# Patient Record
Sex: Male | Born: 1980 | Race: White | Hispanic: No | Marital: Single | Smoking: Never smoker
Health system: Southern US, Community
[De-identification: ages and names within clinical notes are randomized; demographics above are authoritative.]

## PROBLEM LIST (undated history)

## (undated) DIAGNOSIS — K219 Gastro-esophageal reflux disease without esophagitis: Secondary | ICD-10-CM

## (undated) DIAGNOSIS — G5603 Carpal tunnel syndrome, bilateral upper limbs: Secondary | ICD-10-CM

## (undated) HISTORY — PX: WISDOM TOOTH EXTRACTION: SHX21

---

## 2013-05-25 ENCOUNTER — Other Ambulatory Visit: Payer: Self-pay | Admitting: Orthopedic Surgery

## 2013-06-22 DIAGNOSIS — G5603 Carpal tunnel syndrome, bilateral upper limbs: Secondary | ICD-10-CM

## 2013-06-22 HISTORY — DX: Carpal tunnel syndrome, bilateral upper limbs: G56.03

## 2013-06-23 ENCOUNTER — Encounter (HOSPITAL_BASED_OUTPATIENT_CLINIC_OR_DEPARTMENT_OTHER): Payer: Self-pay | Admitting: *Deleted

## 2013-06-29 ENCOUNTER — Encounter (HOSPITAL_BASED_OUTPATIENT_CLINIC_OR_DEPARTMENT_OTHER): Payer: Self-pay | Admitting: *Deleted

## 2013-06-29 ENCOUNTER — Ambulatory Visit (HOSPITAL_BASED_OUTPATIENT_CLINIC_OR_DEPARTMENT_OTHER)
Admission: RE | Admit: 2013-06-29 | Discharge: 2013-06-29 | Disposition: A | Discharge status: Home / Self Care | Payer: 59 | Source: Ambulatory Visit | Attending: Orthopedic Surgery | Admitting: Orthopedic Surgery

## 2013-06-29 ENCOUNTER — Ambulatory Visit (HOSPITAL_BASED_OUTPATIENT_CLINIC_OR_DEPARTMENT_OTHER): Payer: 59 | Admitting: Anesthesiology

## 2013-06-29 ENCOUNTER — Encounter (HOSPITAL_BASED_OUTPATIENT_CLINIC_OR_DEPARTMENT_OTHER)
Admission: RE | Disposition: A | Discharge status: Home / Self Care | Payer: Self-pay | Source: Ambulatory Visit | Attending: Orthopedic Surgery

## 2013-06-29 ENCOUNTER — Encounter (HOSPITAL_BASED_OUTPATIENT_CLINIC_OR_DEPARTMENT_OTHER): Payer: 59 | Admitting: Anesthesiology

## 2013-06-29 DIAGNOSIS — IMO0002 Reserved for concepts with insufficient information to code with codable children: Secondary | ICD-10-CM | POA: Insufficient documentation

## 2013-06-29 DIAGNOSIS — F17200 Nicotine dependence, unspecified, uncomplicated: Secondary | ICD-10-CM | POA: Insufficient documentation

## 2013-06-29 DIAGNOSIS — K219 Gastro-esophageal reflux disease without esophagitis: Secondary | ICD-10-CM | POA: Insufficient documentation

## 2013-06-29 DIAGNOSIS — Z79899 Other long term (current) drug therapy: Secondary | ICD-10-CM | POA: Insufficient documentation

## 2013-06-29 HISTORY — PX: CARPAL TUNNEL RELEASE: SHX101

## 2013-06-29 HISTORY — DX: Carpal tunnel syndrome, bilateral upper limbs: G56.03

## 2013-06-29 HISTORY — DX: Gastro-esophageal reflux disease without esophagitis: K21.9

## 2013-06-29 LAB — POCT HEMOGLOBIN-HEMACUE: Hemoglobin: 15.3 g/dL (ref 13.0–17.0)

## 2013-06-29 SURGERY — CARPAL TUNNEL RELEASE
Anesthesia: Monitor Anesthesia Care | Site: Wrist | Laterality: Bilateral

## 2013-06-29 MED ORDER — MIDAZOLAM HCL 2 MG/ML PO SYRP: 12.0000 mg | ORAL_SOLUTION | Freq: Once | ORAL | Status: DC | PRN

## 2013-06-29 MED ORDER — ONDANSETRON HCL 4 MG/2ML IJ SOLN
INTRAMUSCULAR | Status: DC | PRN
  Administered 2013-06-29: 4 mg via INTRAVENOUS

## 2013-06-29 MED ORDER — DEXAMETHASONE SODIUM PHOSPHATE 10 MG/ML IJ SOLN
INTRAMUSCULAR | Status: DC | PRN
  Administered 2013-06-29: 10 mg via INTRAVENOUS

## 2013-06-29 MED ORDER — MIDAZOLAM HCL 5 MG/5ML IJ SOLN
INTRAMUSCULAR | Status: DC | PRN
  Administered 2013-06-29: 2 mg via INTRAVENOUS

## 2013-06-29 MED ORDER — OXYCODONE HCL 5 MG/5ML PO SOLN: 5.0000 mg | Freq: Once | ORAL | Status: DC | PRN

## 2013-06-29 MED ORDER — FENTANYL CITRATE 0.05 MG/ML IJ SOLN: 50.0000 ug | INTRAMUSCULAR | Status: DC | PRN

## 2013-06-29 MED ORDER — OXYCODONE HCL 5 MG PO TABS: 5.0000 mg | ORAL_TABLET | Freq: Once | ORAL | Status: DC | PRN

## 2013-06-29 MED ORDER — CHLORHEXIDINE GLUCONATE 4 % EX LIQD: 60.0000 mL | Freq: Once | CUTANEOUS | Status: DC

## 2013-06-29 MED ORDER — BUPIVACAINE HCL (PF) 0.25 % IJ SOLN
INTRAMUSCULAR | Status: AC
  Filled 2013-06-29: qty 30

## 2013-06-29 MED ORDER — CEFAZOLIN SODIUM-DEXTROSE 2-3 GM-% IV SOLR
INTRAVENOUS | Status: DC | PRN
  Administered 2013-06-29: 2 g via INTRAVENOUS

## 2013-06-29 MED ORDER — LACTATED RINGERS IV SOLN
INTRAVENOUS | Status: DC
  Administered 2013-06-29 (×2): via INTRAVENOUS

## 2013-06-29 MED ORDER — HYDROMORPHONE HCL PF 1 MG/ML IJ SOLN: 0.2500 mg | INTRAMUSCULAR | Status: DC | PRN

## 2013-06-29 MED ORDER — EPHEDRINE SULFATE 50 MG/ML IJ SOLN
INTRAMUSCULAR | Status: DC | PRN
  Administered 2013-06-29: 10 mg via INTRAVENOUS

## 2013-06-29 MED ORDER — BUPIVACAINE HCL (PF) 0.25 % IJ SOLN
INTRAMUSCULAR | Status: DC | PRN
  Administered 2013-06-29: 6 mL

## 2013-06-29 MED ORDER — PROPOFOL 10 MG/ML IV BOLUS
INTRAVENOUS | Status: DC | PRN
  Administered 2013-06-29: 200 mg via INTRAVENOUS

## 2013-06-29 MED ORDER — LIDOCAINE HCL (CARDIAC) 20 MG/ML IV SOLN
INTRAVENOUS | Status: DC | PRN
  Administered 2013-06-29: 50 mg via INTRAVENOUS

## 2013-06-29 MED ORDER — HYDROCODONE-ACETAMINOPHEN 5-325 MG PO TABS: 1.0000 | ORAL_TABLET | Freq: Four times a day (QID) | ORAL | Status: AC | PRN

## 2013-06-29 MED ORDER — MIDAZOLAM HCL 2 MG/2ML IJ SOLN: 1.0000 mg | INTRAMUSCULAR | Status: DC | PRN

## 2013-06-29 MED ORDER — FENTANYL CITRATE 0.05 MG/ML IJ SOLN
INTRAMUSCULAR | Status: DC | PRN
  Administered 2013-06-29: 100 ug via INTRAVENOUS

## 2013-06-29 SURGICAL SUPPLY — 43 items
ADH SKN CLS APL DERMABOND .7 (GAUZE/BANDAGES/DRESSINGS) ×2
BLADE SURG 15 STRL LF DISP TIS (BLADE) ×1 IMPLANT
BLADE SURG 15 STRL SS (BLADE) ×2
BNDG CMPR 9X4 STRL LF SNTH (GAUZE/BANDAGES/DRESSINGS) ×1
BNDG COHESIVE 3X5 TAN STRL LF (GAUZE/BANDAGES/DRESSINGS) ×3 IMPLANT
BNDG ESMARK 4X9 LF (GAUZE/BANDAGES/DRESSINGS) ×1 IMPLANT
BNDG GAUZE ELAST 4 BULKY (GAUZE/BANDAGES/DRESSINGS) ×1 IMPLANT
CHLORAPREP W/TINT 26ML (MISCELLANEOUS) ×3 IMPLANT
CORDS BIPOLAR (ELECTRODE) ×3 IMPLANT
COVER MAYO STAND STRL (DRAPES) ×3 IMPLANT
COVER TABLE BACK 60X90 (DRAPES) ×2 IMPLANT
CUFF TOURNIQUET SINGLE 18IN (TOURNIQUET CUFF) ×3 IMPLANT
DERMABOND ADVANCED (GAUZE/BANDAGES/DRESSINGS) ×2
DERMABOND ADVANCED .7 DNX12 (GAUZE/BANDAGES/DRESSINGS) IMPLANT
DRAPE EXTREMITY T 121X128X90 (DRAPE) ×3 IMPLANT
DRAPE SURG 17X23 STRL (DRAPES) ×3 IMPLANT
DRSG KUZMA FLUFF (GAUZE/BANDAGES/DRESSINGS) ×1 IMPLANT
GAUZE XEROFORM 1X8 LF (GAUZE/BANDAGES/DRESSINGS) ×2 IMPLANT
GLOVE BIO SURGEON STRL SZ 6.5 (GLOVE) ×1 IMPLANT
GLOVE BIOGEL PI IND STRL 7.0 (GLOVE) IMPLANT
GLOVE BIOGEL PI IND STRL 8.5 (GLOVE) ×1 IMPLANT
GLOVE BIOGEL PI INDICATOR 7.0 (GLOVE) ×1
GLOVE BIOGEL PI INDICATOR 8.5 (GLOVE) ×1
GLOVE ECLIPSE 6.5 STRL STRAW (GLOVE) ×1 IMPLANT
GLOVE SURG ORTHO 8.0 STRL STRW (GLOVE) ×2 IMPLANT
GOWN BRE IMP PREV XXLGXLNG (GOWN DISPOSABLE) ×2 IMPLANT
GOWN PREVENTION PLUS XLARGE (GOWN DISPOSABLE) ×2 IMPLANT
NEEDLE 27GAX1X1/2 (NEEDLE) ×1 IMPLANT
NS IRRIG 1000ML POUR BTL (IV SOLUTION) ×2 IMPLANT
PACK BASIN DAY SURGERY FS (CUSTOM PROCEDURE TRAY) ×2 IMPLANT
PAD ABD 8X10 STRL (GAUZE/BANDAGES/DRESSINGS) ×2 IMPLANT
PAD CAST 3X4 CTTN HI CHSV (CAST SUPPLIES) ×1 IMPLANT
PADDING CAST ABS 4INX4YD NS (CAST SUPPLIES) ×1
PADDING CAST ABS COTTON 4X4 ST (CAST SUPPLIES) ×1 IMPLANT
PADDING CAST COTTON 3X4 STRL (CAST SUPPLIES)
SPONGE GAUZE 4X4 12PLY (GAUZE/BANDAGES/DRESSINGS) ×3 IMPLANT
STOCKINETTE 4X48 STRL (DRAPES) ×3 IMPLANT
SUT VICRYL 4-0 PS2 18IN ABS (SUTURE) IMPLANT
SUT VICRYL RAPIDE 4/0 PS 2 (SUTURE) ×2 IMPLANT
SYR BULB 3OZ (MISCELLANEOUS) ×2 IMPLANT
SYR CONTROL 10ML LL (SYRINGE) ×1 IMPLANT
TOWEL OR 17X24 6PK STRL BLUE (TOWEL DISPOSABLE) ×2 IMPLANT
UNDERPAD 30X30 INCONTINENT (UNDERPADS AND DIAPERS) ×3 IMPLANT

## 2013-06-29 NOTE — Anesthesia Preprocedure Evaluation (Addendum)
Anesthesia Evaluation  Patient identified by MRN, date of birth, ID band Patient awake    Reviewed: Allergy & Precautions, H&P , NPO status , Patient's Chart, lab work & pertinent test results  Airway Mallampati: I TM Distance: >3 FB Neck ROM: Full    Dental no notable dental hx. (+) Teeth Intact and Dental Advisory Given   Pulmonary neg pulmonary ROS,  breath sounds clear to auscultation  Pulmonary exam normal       Cardiovascular negative cardio ROS  Rhythm:Regular Rate:Normal     Neuro/Psych negative neurological ROS  negative psych ROS   GI/Hepatic Neg liver ROS, GERD-  Medicated and Controlled,  Endo/Other  negative endocrine ROS  Renal/GU negative Renal ROS  negative genitourinary   Musculoskeletal   Abdominal   Peds  Hematology negative hematology ROS (+)   Anesthesia Other Findings   Reproductive/Obstetrics negative OB ROS                          Anesthesia Physical Anesthesia Plan  ASA: II  Anesthesia Plan: General   Post-op Pain Management:    Induction: Intravenous  Airway Management Planned: LMA  Additional Equipment:   Intra-op Plan:   Post-operative Plan: Extubation in OR  Informed Consent: I have reviewed the patients History and Physical, chart, labs and discussed the procedure including the risks, benefits and alternatives for the proposed anesthesia with the patient or authorized representative who has indicated his/her understanding and acceptance.   Dental advisory given  Plan Discussed with: CRNA  Anesthesia Plan Comments:         Anesthesia Quick Evaluation

## 2013-06-29 NOTE — Anesthesia Postprocedure Evaluation (Signed)
  Anesthesia Post-op Note  Patient: Patrick Dougherty  Procedure(s) Performed: Procedure(s): BILATERAL CARPAL TUNNEL RELEASE (Bilateral)  Patient Location: PACU  Anesthesia Type:General  Level of Consciousness: awake and alert   Airway and Oxygen Therapy: Patient Spontanous Breathing  Post-op Pain: none  Post-op Assessment: Post-op Vital signs reviewed, Patient's Cardiovascular Status Stable and Respiratory Function Stable  Post-op Vital Signs: Reviewed  Filed Vitals:   06/29/13 1530  BP: 155/59  Pulse: 76  Temp:   Resp: 15    Complications: No apparent anesthesia complications

## 2013-06-29 NOTE — Op Note (Signed)
Dictation Number [id number]

## 2013-06-29 NOTE — Anesthesia Procedure Notes (Signed)
Procedure Name: LMA Insertion Date/Time: 06/29/2013 1:39 PM Performed by: Caren Macadam Pre-anesthesia Checklist: Patient identified, Emergency Drugs available, Suction available and Patient being monitored Patient Re-evaluated:Patient Re-evaluated prior to inductionOxygen Delivery Method: Circle System Utilized Preoxygenation: Pre-oxygenation with 100% oxygen Intubation Type: IV induction Ventilation: Mask ventilation without difficulty LMA: LMA inserted LMA Size: 4.0 Number of attempts: 1 Airway Equipment and Method: bite block Placement Confirmation: positive ETCO2 and breath sounds checked- equal and bilateral Tube secured with: Tape Dental Injury: Teeth and Oropharynx as per pre-operative assessment

## 2013-06-29 NOTE — Transfer of Care (Signed)
Immediate Anesthesia Transfer of Care Note  Patient: Patrick Dougherty  Procedure(s) Performed: Procedure(s): BILATERAL CARPAL TUNNEL RELEASE (Bilateral)  Patient Location: PACU  Anesthesia Type:General  Level of Consciousness: awake and alert   Airway & Oxygen Therapy: Patient Spontanous Breathing and Patient connected to face mask oxygen  Post-op Assessment: Report given to PACU RN and Post -op Vital signs reviewed and stable  Post vital signs: Reviewed and stable  Complications: No apparent anesthesia complications

## 2013-06-29 NOTE — Transfer of Care (Deleted)
Immediate Anesthesia Transfer of Care Note  Patient: Patrick Dougherty  Procedure(s) Performed: Procedure(s): BILATERAL CARPAL TUNNEL RELEASE (Bilateral)  Patient Location: PACU  Anesthesia Type:MAC and Regional  Level of Consciousness: awake, alert  and oriented  Airway & Oxygen Therapy: Patient Spontanous Breathing and Patient connected to face mask oxygen  Post-op Assessment: Report given to PACU RN and Post -op Vital signs reviewed and stable  Post vital signs: Reviewed and stable  Complications: No apparent anesthesia complications

## 2013-06-29 NOTE — Brief Op Note (Signed)
06/29/2013  2:39 PM  PATIENT:  Patrick Dougherty  32 y.o. male  PRE-OPERATIVE DIAGNOSIS:  BILATERAL CARPAL TUNNEL SYNDROME  POST-OPERATIVE DIAGNOSIS:  Bilateral Carpal Tunnel Syndrome  PROCEDURE:  Procedure(s): BILATERAL CARPAL TUNNEL RELEASE (Bilateral)  SURGEON:  Surgeon(s) and Role:    * Nicki Reaper, MD - Primary  PHYSICIAN ASSISTANT:   ASSISTANTS: none   ANESTHESIA:   local and general  EBL:  Total I/O In: 1300 [I.V.:1300] Out: -   BLOOD ADMINISTERED:none  DRAINS: none   LOCAL MEDICATIONS USED:  BUPIVICAINE   SPECIMEN:  No Specimen  DISPOSITION OF SPECIMEN:  N/A  COUNTS:  YES  TOURNIQUET:   Total Tourniquet Time Documented: Forearm (Left) - 22 minutes Total: Forearm (Left) - 22 minutes  Forearm (Right) - 17 minutes Total: Forearm (Right) - 17 minutes   DICTATION: .Other Dictation: Dictation Number [id number]  PLAN OF CARE: Discharge to home after PACU  PATIENT DISPOSITION:  PACU - hemodynamically stable.

## 2013-06-29 NOTE — H&P (Signed)
Patrick Dougherty is a 32 year-old right-hand dominant male who comes in complaining of bilateral hand numbness, tingling, pain, dropping things.  This is his thumb, index long fingers and has been going on for approximately three years.  He has been working Holiday representative for ten.  He is awakened 7/7 nights.  He has no history of injury to the hand or neck.  He has been treated by Dr. Jeannetta Nap with prednisone 20 mg. a day for the past eight months.  He complains of a constant, moderate, extremely severe, sharp, stabbing, throbbing, aching burning type pain in both hands with numbness and feeling of weakness.  He has not had any other treatment for this.  He has no history of diabetes, thyroid problems, arthritis or gout. He had nerve conduction studies performed today by Dr. Johna Roles revealing carpal tunnel syndrome bilaterally with a motor delay of 4.7/right, 5.2/left; sensory delay of 2.9/right, 3.2/left with an amplitude diminution to 19.3/right and 10.7/left.   ALLERGIES:      None. MEDICATIONS:       Prednisone, Prilosec. SURGICAL HISTORY:        None. FAMILY MEDICAL HISTORY:   Positive for arthritis.     SOCIAL HISTORY:       He uses DIP, does not drink.  He is single.  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   REVIEW OF SYSTEMS:      Positive for glasses, contacts, otherwise negative 14 points.  Patrick Dougherty is an 32 y.o. male.   Chief Complaint: Bilateral CTS  HPI: see above  Past Medical History  Diagnosis Date  . GERD (gastroesophageal reflux disease)   . Carpal tunnel syndrome on both sides 06/2013    Past Surgical History  Procedure Laterality Date  . Wisdom tooth extraction      History reviewed. No pertinent family history. Social History:  reports that he has never smoked. His smokeless tobacco use includes Snuff. He reports that he does not drink alcohol or use illicit drugs.  Allergies: No Known  Allergies  Medications Prior to Admission  Medication Sig Dispense Refill  . omeprazole (PRILOSEC) 20 MG capsule Take 20 mg by mouth daily.      . predniSONE (DELTASONE) 10 MG tablet Take 10 mg by mouth daily with breakfast.        No results found for this or any previous visit (from the past 48 hour(s)).  No results found.   Pertinent items are noted in HPI.  Blood pressure 111/72, pulse 57, temperature 98.1 F (36.7 C), temperature source Oral, resp. rate 16, height 5\' 11"  (1.803 m), weight 176 lb (79.833 kg), SpO2 98.00%.  General appearance: alert, cooperative and appears stated age Head: Normocephalic, without obvious abnormality Neck: no JVD Resp: clear to auscultation bilaterally Cardio: regular rate and rhythm, S1, S2 normal, no murmur, click, rub or gallop GI: soft, non-tender; bowel sounds normal; no masses,  no organomegaly Extremities: extremities normal, atraumatic, no cyanosis or edema Pulses: 2+ and symmetric Skin: Skin color, texture, turgor normal. No rashes or lesions Neurologic: Grossly normal Incision/Wound: na  Assessment/Plan  DX: bilateral carpal tunnel syndrome He is scheduled for bilateral carpal tunnel release as an outpatient under regional anesthesia.  Questions were invited and answered in detail.   He has indeed decided to have both sides done.   Patrick Dougherty 06/29/2013, 1:01 PM

## 2013-06-30 NOTE — Op Note (Signed)
Patrick Dougherty, Patrick Dougherty               ACCOUNT NO.:  [id number]  MEDICAL RECORD NO.:  [id number]  LOCATION:                                 FACILITY:  PHYSICIAN:  Cindee Salt, M.D.            DATE OF BIRTH:  DATE OF PROCEDURE:  06/29/2013 DATE OF DISCHARGE:                              OPERATIVE REPORT   PREOPERATIVE DIAGNOSIS:  Bilateral carpal tunnel syndrome.  POSTOPERATIVE DIAGNOSIS:  Bilateral carpal tunnel syndrome.  PROCEDURE:  Bilateral decompression, median nerves at the wrist.  SURGEON:  Cindee Salt, MD  ANESTHESIA:  General with local infiltration.  ANESTHESIOLOGIST:  Zenon Mayo, MD  HISTORY:  The patient is a 32 year old male with a history of carpal tunnel syndrome.  Nerve conductions positive bilaterally.  He has elected to undergo bilateral decompression.  Pre, peri, and postoperative course have been discussed along with risks and complications.  He is aware that there is no guarantee with the surgery; possibility of infection; recurrence of injury to arteries, nerves, tendons, incomplete relief of symptoms, dystrophy.  In the preoperative area, the patient is seen, the extremity marked by both patient and surgeon.  Left side was approached first.  A prep was done with ChloraPrep after taking a time-out confirming patient procedures in supine position.  The limb was exsanguinated with an Esmarch bandage. Tourniquet placed on the forearm was inflated to 250 mmHg.  A longitudinal incision was made in the left palm, carried down through subcutaneous tissue.  Bleeders were electrocauterized with bipolar. Palmar fascia was split.  Superficial palmar arch identified.  The flexor tendon to the ring little finger identified to the ulnar side of the median nerve.  Carpal retinaculum was incised with sharp dissection. Right angle and Sewall retractor were placed between skin and forearm fascia.  The fascia released for approximately a centimeter and half to 2 cm  under direct vision.  The canal was explored.  Air compression to the nerve was apparent.  Motor branch entered in the muscle.  No further lesions were identified.  The wound was irrigated.  Prior to the procedure, local infiltration with 0.25% Marcaine without epinephrine was given, 6 mL was used.  After irrigation, the wound was closed with subcuticular 4-0 Vicryl Rapide sutures and Dermabond.  Sterile compressive dressing was applied.  Tourniquet deflated.  The right side was attended to next after ChloraPrep prep, 3 minute dry time.  Time-out confirming patient and procedure.  The limb was exsanguinated with an Esmarch bandage.  Tourniquet placed on the right forearm was inflated to 250 mmHg.  A longitudinal incision made in the right palm, carried down through subcutaneous tissue.  Bleeders again electrocauterized with bipolar.  The palmar fascia was split, superficial palmar arch identified.  The flexor tendon to the ring and little finger identified to the ulnar side of median nerve.  Carpal retinaculum was incised with sharp dissection.  Right angle and Sewall retractor were placed between skin and forearm fascia.  The fascia released for approximately a centimeter and half proximal to the wrist crease under direct vision. Again the canal was explored.  Air compression to the nerve was apparent.  No further lesions were identified.  The motor branch entered in the muscle.  The wound was irrigated and closed with a subcuticular 4- 0 Vicryl Rapide sutures at the beginning of the procedure.  This site was also injected with Marcaine 6 mL, was also used on the right side. Dermabond placed after drying and a nonadherent gauze and compressive dressing applied.  On deflation of the tourniquet, all fingers immediately pinked.  The patient tolerated the procedure well and was taken to the recovery room for observation in satisfactory condition.  He will be discharged home to return in 1  week on Norco.          ______________________________ Cindee Salt, M.D.     GK/MEDQ  D:  06/29/2013  T:  06/30/2013  Job:  [id number]

## 2013-07-01 ENCOUNTER — Encounter (HOSPITAL_BASED_OUTPATIENT_CLINIC_OR_DEPARTMENT_OTHER): Payer: Self-pay | Admitting: Orthopedic Surgery

## 2019-08-11 ENCOUNTER — Other Ambulatory Visit: Payer: Self-pay

## 2019-08-11 ENCOUNTER — Other Ambulatory Visit: Payer: Self-pay | Admitting: Chiropractor

## 2019-08-11 ENCOUNTER — Ambulatory Visit
Admission: RE | Admit: 2019-08-11 | Discharge: 2019-08-11 | Disposition: A | Discharge status: Home / Self Care | Payer: No Typology Code available for payment source | Source: Ambulatory Visit | Attending: Chiropractor | Admitting: Chiropractor

## 2019-08-11 DIAGNOSIS — M542 Cervicalgia: Secondary | ICD-10-CM

## 2020-09-06 IMAGING — CR DG CERVICAL SPINE 2 OR 3 VIEWS
3 series · 3 of 3 positions shown · non-contrast
Comparison: None.

CLINICAL DATA: Chronic right neck and upper back pain. No reported
injury.

EXAM:
CERVICAL SPINE - 2-3 VIEW

[w cervical spine lat]
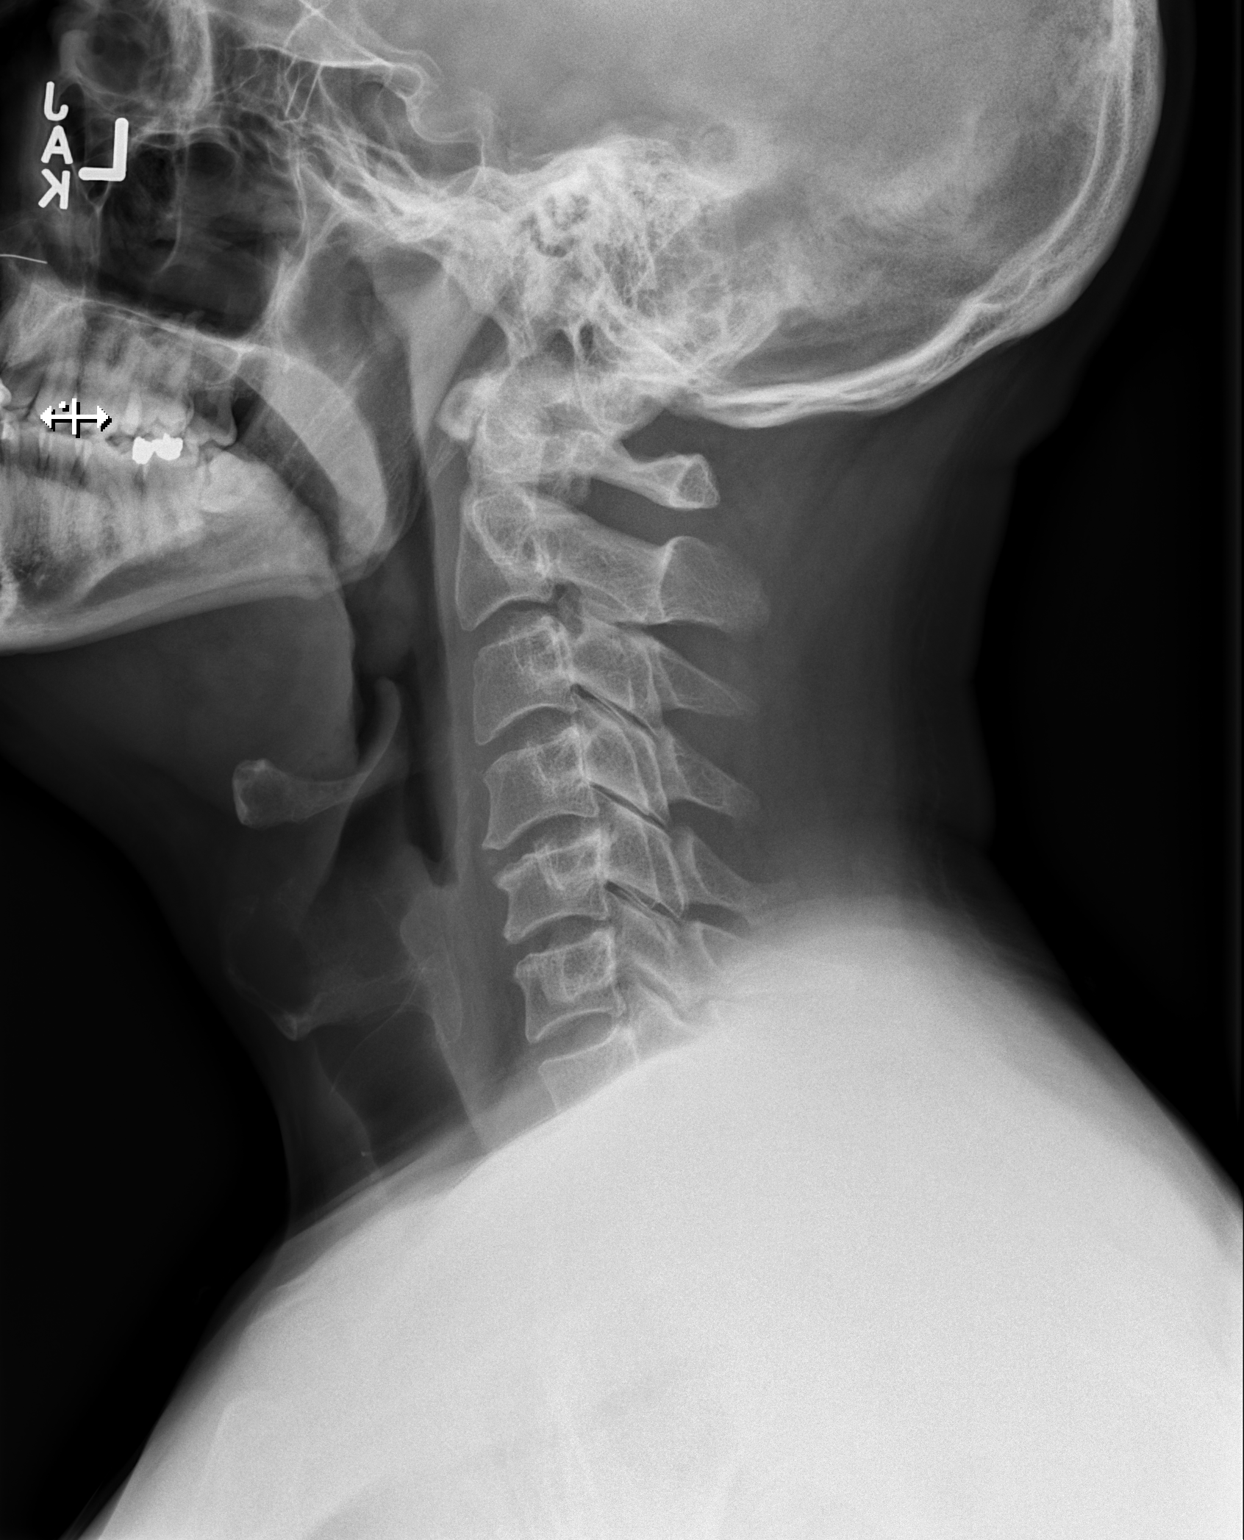

[w cervical spine ap (1 of 2)]
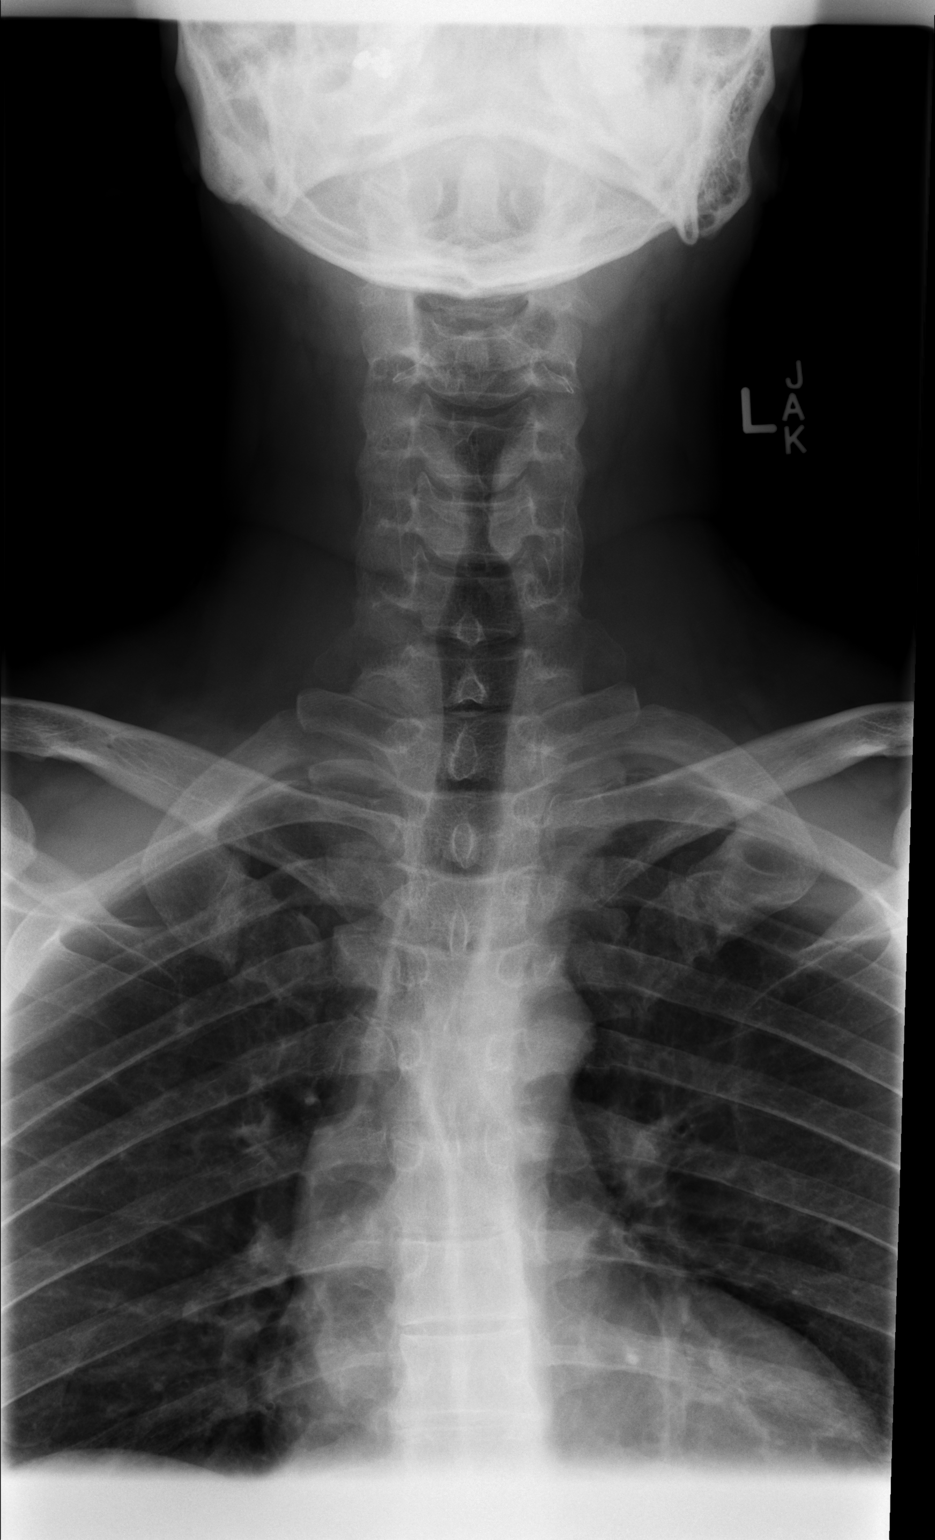

[w cervical spine ap (2 of 2)]
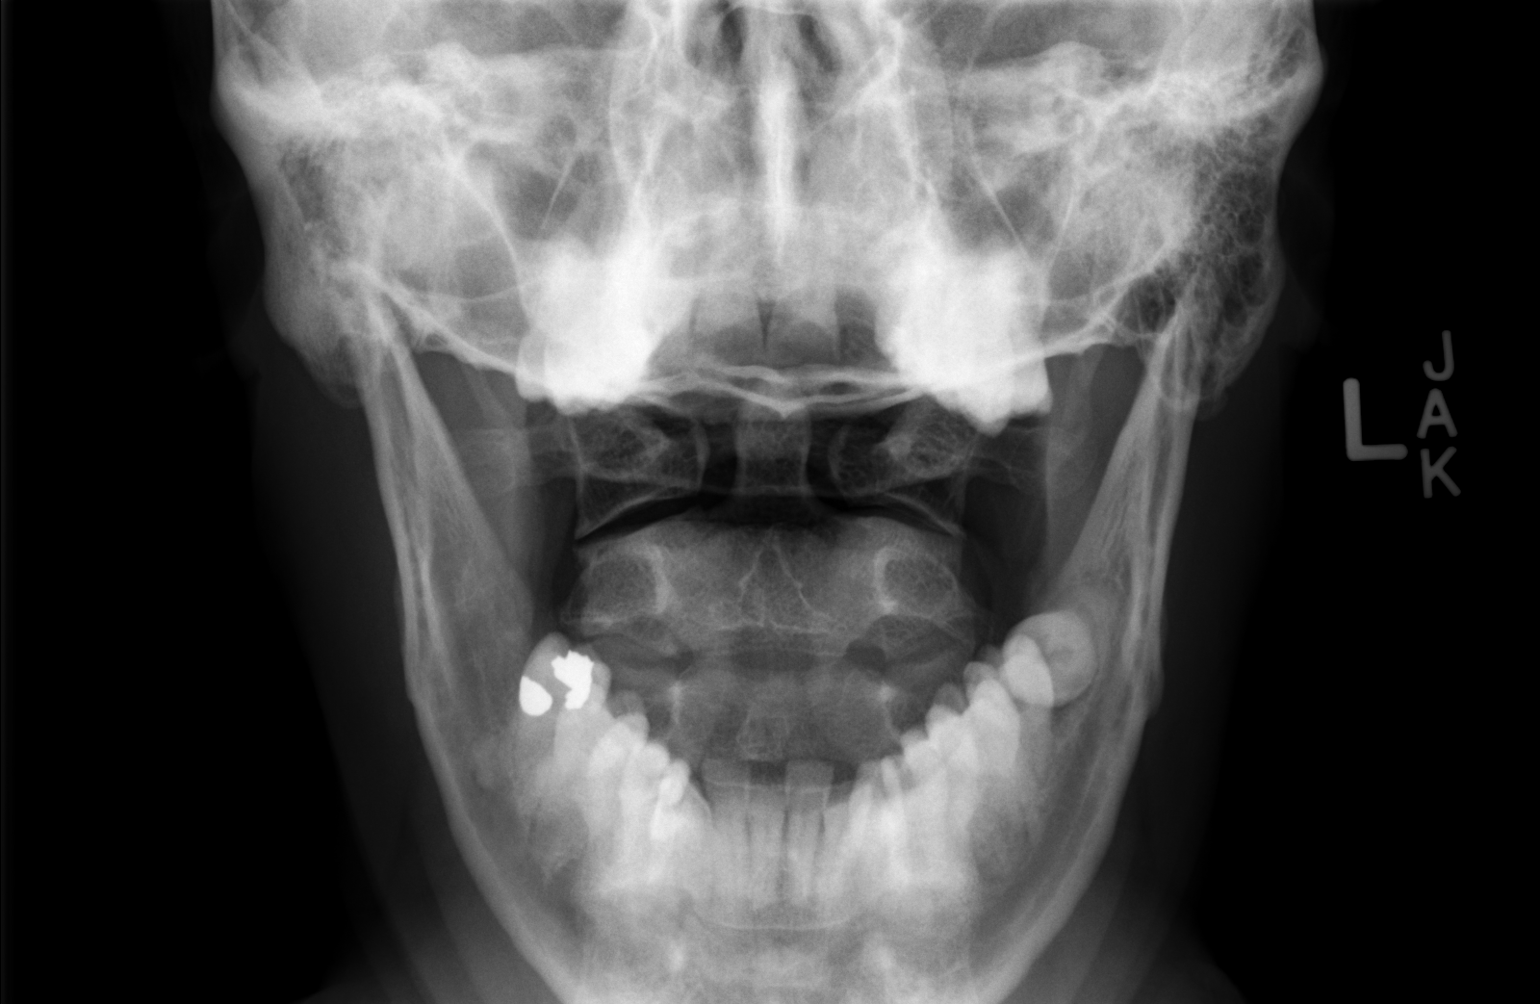

[3 of 3 positions shown; findings below may reference images not displayed]

FINDINGS: On the lateral view the cervical spine is visualized to the level of
C6-7. Straightening of the cervical spine. Pre-vertebral soft
tissues are within normal limits. No fracture is detected in the
cervical spine. Dens is well positioned between the lateral masses
of C1. Mild degenerative disc disease in the mid to lower cervical
spine, most prominent at C4-5. No subluxation. No significant facet
arthropathy. No aggressive-appearing focal osseous lesions.
IMPRESSION: 1. Mild degenerative disc disease in the mid to lower cervical
spine, most prominent at C4-5.
2. Straightening of the cervical spine, usually due to positioning
and/or muscle spasm.

## 2021-03-08 ENCOUNTER — Ambulatory Visit
Admission: EM | Admit: 2021-03-08 | Discharge: 2021-03-08 | Disposition: A | Discharge status: Home / Self Care | Payer: Self-pay | Attending: Internal Medicine | Admitting: Internal Medicine

## 2021-03-08 ENCOUNTER — Encounter: Payer: Self-pay | Admitting: Emergency Medicine

## 2021-03-08 ENCOUNTER — Other Ambulatory Visit: Payer: Self-pay

## 2021-03-08 DIAGNOSIS — J069 Acute upper respiratory infection, unspecified: Secondary | ICD-10-CM | POA: Insufficient documentation

## 2021-03-08 DIAGNOSIS — Z1152 Encounter for screening for COVID-19: Secondary | ICD-10-CM | POA: Insufficient documentation

## 2021-03-08 DIAGNOSIS — R6889 Other general symptoms and signs: Secondary | ICD-10-CM | POA: Insufficient documentation

## 2021-03-08 DIAGNOSIS — J029 Acute pharyngitis, unspecified: Secondary | ICD-10-CM | POA: Insufficient documentation

## 2021-03-08 LAB — POCT INFLUENZA A/B
Influenza A, POC: NEGATIVE
Influenza B, POC: NEGATIVE

## 2021-03-08 LAB — POCT RAPID STREP A (OFFICE): Rapid Strep A Screen: NEGATIVE

## 2021-03-08 NOTE — ED Provider Notes (Signed)
[facility]    CSN: [id number] Arrival date & time: 03/08/21  0919      History   Chief Complaint Chief Complaint  Patient presents with   Generalized Body Aches   Sore Throat    HPI Patrick Dougherty is a 40 y.o. male.   Patient presents with 2-day history of generalized body aches, sore throat, nasal congestion, headache, fever.  T-max at home was 102.  Patient has taken Tylenol over-the-counter with relief of fever but with no relief of headache.  Denies any known sick contacts.  Denies any chest pain or shortness of breath.   Sore Throat   Past Medical History:  Diagnosis Date   Carpal tunnel syndrome on both sides 06/2013   GERD (gastroesophageal reflux disease)     There are no problems to display for this patient.   Past Surgical History:  Procedure Laterality Date   CARPAL TUNNEL RELEASE Bilateral 06/29/2013   Procedure: BILATERAL CARPAL TUNNEL RELEASE;  Surgeon: Nicki Reaper, MD;  Location: [facility];  Service: Orthopedics;  Laterality: Bilateral;   WISDOM TOOTH EXTRACTION         Home Medications    Prior to Admission medications   Medication Sig Start Date End Date Taking? Authorizing Provider  HYDROcodone-acetaminophen (NORCO) 5-325 MG per tablet Take 1 tablet by mouth every 6 (six) hours as needed for moderate pain. 06/29/13   Cindee Salt, MD  omeprazole (PRILOSEC) 20 MG capsule Take 20 mg by mouth daily.    [provider]  predniSONE (DELTASONE) 10 MG tablet Take 10 mg by mouth daily with breakfast.    [provider]    Family History History reviewed. No pertinent family history.  Social History Social History   Tobacco Use   Smoking status: Never   Smokeless tobacco: Current    Types: Snuff  Substance Use Topics   Alcohol use: No   Drug use: No     Allergies   Patient has no known allergies.   Review of Systems Review of Systems Per HPI  Physical Exam Triage Vital Signs ED  Triage Vitals  Enc Vitals Group     BP 03/08/21 0933 133/86     Pulse Rate 03/08/21 0933 (!) 111     Resp 03/08/21 0933 18     Temp 03/08/21 0933 98.3 F (36.8 C)     Temp Source 03/08/21 0933 Oral     SpO2 03/08/21 0933 98 %     Weight --      Height --      Head Circumference --      Peak Flow --      Pain Score 03/08/21 0929 6     Pain Loc --      Pain Edu? --      Excl. in GC? --    No data found.  Updated Vital Signs BP 133/86 (BP Location: Right Arm)   Pulse (!) 111   Temp 98.3 F (36.8 C) (Oral)   Resp 18   SpO2 98%   Visual Acuity Right Eye Distance:   Left Eye Distance:   Bilateral Distance:    Right Eye Near:   Left Eye Near:    Bilateral Near:     Physical Exam Constitutional:      General: He is not in acute distress.    Appearance: Normal appearance.  HENT:     Head: Normocephalic and atraumatic.     Right Ear: Tympanic membrane  and ear canal normal.     Left Ear: Tympanic membrane and ear canal normal.     Nose: Congestion present.     Mouth/Throat:     Mouth: Mucous membranes are moist.     Pharynx: Posterior oropharyngeal erythema present.  Eyes:     Extraocular Movements: Extraocular movements intact.     Conjunctiva/sclera: Conjunctivae normal.     Pupils: Pupils are equal, round, and reactive to light.  Cardiovascular:     Rate and Rhythm: Normal rate and regular rhythm.     Pulses: Normal pulses.     Heart sounds: Normal heart sounds.  Pulmonary:     Effort: Pulmonary effort is normal. No respiratory distress.     Breath sounds: Normal breath sounds. No wheezing.  Abdominal:     General: Abdomen is flat. Bowel sounds are normal.     Palpations: Abdomen is soft.  Musculoskeletal:        General: Normal range of motion.     Cervical back: Normal range of motion.  Skin:    General: Skin is warm and dry.  Neurological:     General: No focal deficit present.     Mental Status: He is alert and oriented to person, place, and time.  Mental status is at baseline.  Psychiatric:        Mood and Affect: Mood normal.        Behavior: Behavior normal.     UC Treatments / Results  Labs (all labs ordered are listed, but only abnormal results are displayed) Labs Reviewed  NOVEL CORONAVIRUS, NAA  CULTURE, GROUP A STREP [facility])  POCT RAPID STREP A (OFFICE)  POCT INFLUENZA A/B    EKG   Radiology No results found.  Procedures Procedures (including critical care time)  Medications Ordered in UC Medications - No data to display  Initial Impression / Assessment and Plan / UC Course  I have reviewed the triage vital signs and the nursing notes.  Pertinent labs & imaging results that were available during my care of the patient were reviewed by me and considered in my medical decision making (see chart for details).     Patient presents with symptoms likely from a viral upper respiratory infection. Differential includes bacterial pneumonia, sinusitis, allergic rhinitis, Covid 19. Do not suspect underlying cardiopulmonary process. Symptoms seem unlikely related to ACS, CHF or COPD exacerbations, pneumonia, pneumothorax. Patient is nontoxic appearing and not in need of emergent medical intervention.  Recommended symptom control with over the counter medications: Daily oral anti-histamine, Oral decongestant or IN corticosteroid, saline irrigations, cepacol lozenges, Robitussin, Delsym, honey tea.  Rapid strep and rapid flu test were negative in urgent care today.  COVID-19 viral swab and throat culture are pending.  Highly suspicious for COVID-19 given patient's symptoms.  Offered patient ketorolac injection in urgent care today for headache.  Patient declined.  Discussed fever monitoring and management with patient.  Advised patient to go to the hospital if headache continues and is still refractory to treatment.  Return if symptoms fail to improve in 1-2 weeks or you develop shortness of breath, chest pain, severe  headache. Patient states understanding and is agreeable.  Discharged with PCP or urgent care followup.  Final Clinical Impressions(s) / UC Diagnoses   Final diagnoses:  Encounter for screening for COVID-19  Flu-like symptoms  Sore throat  Acute upper respiratory infection     Discharge Instructions      You likely having a viral upper respiratory infection. We  recommended symptom control. I expect your symptoms to start improving in the next 1-2 weeks.   1. Take a daily allergy pill/anti-histamine like Zyrtec, Claritin, or Store brand consistently for 2 weeks  2. For congestion you may try an oral decongestant like Mucinex or sudafed. You may also try intranasal flonase nasal spray or saline irrigations (neti pot, sinus cleanse)  3. For your sore throat you may try cepacol lozenges, salt water gargles, throat spray. Treatment of congestion may also help your sore throat.  4. For cough you may try Robitussen, Mucinex DM  5. Take Tylenol or Ibuprofen to help with pain/inflammation  6. Stay hydrated, drink plenty of fluids to keep throat coated and less irritated  Honey Tea For cough/sore throat try using a honey-based tea. Use 3 teaspoons of honey with juice squeezed from half lemon. Place shaved pieces of ginger into 1/2-1 cup of water and warm over stove top. Then mix the ingredients and repeat every 4 hours as needed.   Your COVID-19 viral swab and throat culture pending.  We will call if these are positive.     ED Prescriptions   None    PDMP not reviewed this encounter.   Lance Muss, FNP 03/08/21 1026

## 2021-03-08 NOTE — Discharge Instructions (Signed)
You likely having a viral upper respiratory infection. We recommended symptom control. I expect your symptoms to start improving in the next 1-2 weeks.   1. Take a daily allergy pill/anti-histamine like Zyrtec, Claritin, or Store brand consistently for 2 weeks  2. For congestion you may try an oral decongestant like Mucinex or sudafed. You may also try intranasal flonase nasal spray or saline irrigations (neti pot, sinus cleanse)  3. For your sore throat you may try cepacol lozenges, salt water gargles, throat spray. Treatment of congestion may also help your sore throat.  4. For cough you may try Robitussen, Mucinex DM  5. Take Tylenol or Ibuprofen to help with pain/inflammation  6. Stay hydrated, drink plenty of fluids to keep throat coated and less irritated  Honey Tea For cough/sore throat try using a honey-based tea. Use 3 teaspoons of honey with juice squeezed from half lemon. Place shaved pieces of ginger into 1/2-1 cup of water and warm over stove top. Then mix the ingredients and repeat every 4 hours as needed.   Your COVID-19 viral swab and throat culture pending.  We will call if these are positive.

## 2021-03-08 NOTE — ED Triage Notes (Signed)
Pt said x 2 day has had body aches, headache, chills, fever, sore throat. Is taking tylenol for fever and headache.

## 2021-03-09 LAB — NOVEL CORONAVIRUS, NAA: SARS-CoV-2, NAA: NOT DETECTED

## 2021-03-09 LAB — SARS-COV-2, NAA 2 DAY TAT

## 2021-03-10 LAB — CULTURE, GROUP A STREP (THRC)

## 2024-02-25 ENCOUNTER — Emergency Department (HOSPITAL_COMMUNITY): Payer: Self-pay

## 2024-02-25 ENCOUNTER — Emergency Department (HOSPITAL_COMMUNITY)
Admission: EM | Admit: 2024-02-25 | Discharge: 2024-02-25 | Disposition: A | Discharge status: Home / Self Care | Payer: Self-pay | Attending: Emergency Medicine | Admitting: Emergency Medicine

## 2024-02-25 ENCOUNTER — Other Ambulatory Visit: Payer: Self-pay

## 2024-02-25 DIAGNOSIS — R569 Unspecified convulsions: Secondary | ICD-10-CM | POA: Insufficient documentation

## 2024-02-25 LAB — CBC WITH DIFFERENTIAL/PLATELET
Abs Immature Granulocytes: 0.03 K/uL (ref 0.00–0.07)
Basophils Absolute: 0 K/uL (ref 0.0–0.1)
Basophils Relative: 0 %
Eosinophils Absolute: 0.1 K/uL (ref 0.0–0.5)
Eosinophils Relative: 1 %
HCT: 40.2 % (ref 39.0–52.0)
Hemoglobin: 13.9 g/dL (ref 13.0–17.0)
Immature Granulocytes: 1 %
Lymphocytes Relative: 21 %
Lymphs Abs: 1.2 K/uL (ref 0.7–4.0)
MCH: 33.8 pg (ref 26.0–34.0)
MCHC: 34.6 g/dL (ref 30.0–36.0)
MCV: 97.8 fL (ref 80.0–100.0)
Monocytes Absolute: 0.4 K/uL (ref 0.1–1.0)
Monocytes Relative: 6 %
Neutro Abs: 4.2 K/uL (ref 1.7–7.7)
Neutrophils Relative %: 71 %
Platelets: 129 K/uL — ABNORMAL LOW (ref 150–400)
RBC: 4.11 MIL/uL — ABNORMAL LOW (ref 4.22–5.81)
RDW: 11.7 % (ref 11.5–15.5)
WBC: 5.9 K/uL (ref 4.0–10.5)
nRBC: 0 % (ref 0.0–0.2)

## 2024-02-25 LAB — BASIC METABOLIC PANEL WITH GFR
Anion gap: 13 (ref 5–15)
BUN: 8 mg/dL (ref 6–20)
CO2: 18 mmol/L — ABNORMAL LOW (ref 22–32)
Calcium: 8.9 mg/dL (ref 8.9–10.3)
Chloride: 99 mmol/L (ref 98–111)
Creatinine, Ser: 1.25 mg/dL — ABNORMAL HIGH (ref 0.61–1.24)
GFR, Estimated: >60 mL/min (ref >60)
Glucose, Bld: 138 mg/dL — ABNORMAL HIGH (ref 70–99)
Potassium: 3.7 mmol/L (ref 3.5–5.1)
Sodium: 130 mmol/L — ABNORMAL LOW (ref 135–145)

## 2024-02-25 LAB — RAPID URINE DRUG SCREEN, HOSP PERFORMED
Amphetamines: NOT DETECTED
Barbiturates: NOT DETECTED
Benzodiazepines: NOT DETECTED
Cocaine: NOT DETECTED
Opiates: NOT DETECTED
Tetrahydrocannabinol: NOT DETECTED

## 2024-02-25 MED ORDER — KETOROLAC TROMETHAMINE 15 MG/ML IJ SOLN
15.0000 mg | Freq: Once | INTRAMUSCULAR | Status: AC
  Administered 2024-02-25: 15 mg via INTRAVENOUS
  Filled 2024-02-25: qty 1

## 2024-02-25 MED ORDER — ACETAMINOPHEN 500 MG PO TABS
1000.0000 mg | ORAL_TABLET | Freq: Once | ORAL | Status: AC
  Administered 2024-02-25: 1000 mg via ORAL
  Filled 2024-02-25: qty 2

## 2024-02-25 NOTE — Discharge Instructions (Addendum)
 Until you are cleared by neurology you should not drive.  It would be a good idea to stay with someone over the next few days just to make sure you have no further seizures.  Make sure you are eating and drinking regularly and getting plenty of sleep.  Avoid alcohol at this time.

## 2024-02-25 NOTE — ED Provider Notes (Addendum)
 [place] EMERGENCY DEPARTMENT AT [facility] Provider Note   CSN: [id number] Arrival date & time: 02/25/24  1250     Patient presents with: Seizures   Patrick Dougherty is a 43 y.o. male.   Patient is a 43 year old male with a history of acid reflux on Prilosec with no other known medical problems who is presenting today with EMS after having a seizure like activity at work.  Patient reports that he had been in his normal state of health and was actually orienting on a new job today.  He remembers getting to work at 9 AM and going through orientation.  He does not report feeling bad prior to the episode but just remembers waking up with a bunch of people around him.  EMS reported that the other people at the job site said he had 2 episodes of seizure-like activity that lasted approximately 2 minutes.  It was reported that he fell forward and hit his head and EMS reported patient was postictal on the scene upon their arrival.  Upon arrival here he is awake and alert and able to answer all questions.  He denies having any symptoms of nausea or vomiting before or after the event.  He denies any chest pain or shortness of breath.  Reports that he drinks about 3 times a week but does not drink heavily and if he does not have alcohol he feels fine.  He denies any drug use and will occasionally smokes cigarettes.  He denies any recent infectious symptoms or feeling unwell.  He takes no other medications except for omeprazole.  He does complain of a mild headache where he hit his head today but was not having headaches prior.  No family history of seizures.  He had no history of seizures when he was younger.  He denies any prior head trauma.  Patient does report he has not had anything to eat today because he usually does not eat breakfast and he had not gotten around to lunch yet.  EMS reported a CBG of 95.  The history is provided by the patient and the EMS personnel.  Seizures      Prior to  Admission medications   Medication Sig Start Date End Date Taking? Authorizing Provider  HYDROcodone -acetaminophen  (NORCO) 5-325 MG per tablet Take 1 tablet by mouth every 6 (six) hours as needed for moderate pain. 06/29/13   Murrell Kuba, MD  omeprazole (PRILOSEC) 20 MG capsule Take 20 mg by mouth daily.    [provider]  predniSONE (DELTASONE) 10 MG tablet Take 10 mg by mouth daily with breakfast.    [provider]    Allergies: Patient has no known allergies.    Review of Systems  Neurological:  Positive for seizures.    Updated Vital Signs BP (!) 135/94   Pulse (!) 111   Temp 98.6 F (37 C) (Oral)   Resp 16   Ht 5' 11 (1.803 m)   Wt 83.9 kg   SpO2 98%   BMI 25.80 kg/m   Physical Exam Vitals and nursing note reviewed.  Constitutional:      General: He is not in acute distress.    Appearance: He is well-developed.  HENT:     Head: Normocephalic and atraumatic.   Eyes:     Conjunctiva/sclera: Conjunctivae normal.     Pupils: Pupils are equal, round, and reactive to light.  Cardiovascular:     Rate and Rhythm: Regular rhythm. Tachycardia present.  Heart sounds: No murmur heard. Pulmonary:     Effort: Pulmonary effort is normal. No respiratory distress.     Breath sounds: Normal breath sounds. No wheezing or rales.  Abdominal:     General: There is no distension.     Palpations: Abdomen is soft.     Tenderness: There is no abdominal tenderness. There is no guarding or rebound.  Musculoskeletal:        General: No tenderness. Normal range of motion.     Cervical back: Normal range of motion and neck supple. No tenderness. No spinous process tenderness or muscular tenderness.  Skin:    General: Skin is warm and dry.     Findings: No erythema or rash.  Neurological:     Mental Status: He is alert and oriented to person, place, and time.     Cranial Nerves: No dysarthria or facial asymmetry.     Sensory: Sensation is intact.     Motor: Motor  function is intact.     Comments: 5-5 strength in upper and lower extremities.  Mild intention tremor in the hands and feet with activity which she reports this started after having the seizure.  Speech is normal.  No visual field cuts.  Psychiatric:        Mood and Affect: Mood normal.        Behavior: Behavior normal.     (all labs ordered are listed, but only abnormal results are displayed) Labs Reviewed  CBC WITH DIFFERENTIAL/PLATELET - Abnormal; Notable for the following components:      Result Value   RBC 4.11 (*)    Platelets 129 (*)    All other components within normal limits  BASIC METABOLIC PANEL WITH GFR - Abnormal; Notable for the following components:   Sodium 130 (*)    CO2 18 (*)    Glucose, Bld 138 (*)    Creatinine, Ser 1.25 (*)    All other components within normal limits  RAPID URINE DRUG SCREEN, HOSP PERFORMED    EKG: EKG Interpretation Date/Time:  Tuesday February 25 2024 12:56:17 EDT Ventricular Rate:  107 PR Interval:  146 QRS Duration:  92 QT Interval:  336 QTC Calculation: 449 R Axis:   -24  Text Interpretation: Sinus tachycardia Borderline left axis deviation No previous tracing Confirmed by Doretha Folks ([id number]) on 02/25/2024 1:37:50 PM  Radiology: CT Head Wo Contrast Result Date: 02/25/2024 EXAM: CT HEAD WITHOUT CONTRAST 02/25/2024 01:30:00 PM TECHNIQUE: CT of the head was performed without the administration of intravenous contrast. Automated exposure control, iterative reconstruction, and/or weight based adjustment of the mA/kV was utilized to reduce the radiation dose to as low as reasonably achievable. COMPARISON: None available. CLINICAL HISTORY: Seizure, new-onset, no history of trauma. Seizure. Hit right forehead area. FINDINGS: BRAIN AND VENTRICLES: No acute hemorrhage. Gray-white differentiation is preserved. No hydrocephalus. No extra-axial collection. No mass effect or midline shift. ORBITS: No acute abnormality. SINUSES: No acute  abnormality. SOFT TISSUES AND SKULL: No acute soft tissue abnormality. No skull fracture. IMPRESSION: 1. No acute intracranial abnormality. Electronically signed by: evalene coho 02/25/2024 01:56 PM EDT RP Workstation: [id number]     Procedures   Medications Ordered in the ED  acetaminophen  (TYLENOL ) tablet 1,000 mg (1,000 mg Oral Given 02/25/24 1349)                                    Medical Decision Making Amount and/or  Complexity of Data Reviewed External Data Reviewed: notes. Labs: ordered. Decision-making details documented in ED Course. Radiology: ordered and independent interpretation performed. Decision-making details documented in ED Course. ECG/medicine tests: ordered and independent interpretation performed. Decision-making details documented in ED Course.  Risk OTC drugs. Prescription drug management.   Pt presenting today with a complaint that caries a high risk for morbidity and mortality. Here today with the first time possible seizure.  Seizure-like activity at the scene with a postictal phase.  Patient did not lose bowel or bladder control and did not bite his tongue.  Denies prior history of anything like this happening.  Denied any chest pain or shortness of breath.  He is on continuous cardiac monitoring does show sinus tachycardia but no dysrhythmia otherwise.  Patient did injure his head during this event and also given concern for first-time seizure will do a CT to evaluate for any space-occupying lesions.  He is neurologically intact at this time.  Patient given something to eat.  Labs are pending.  3:47 PM I independently interpreted patient's EKG and labs.  EKG with a sinus tachycardia but no other acute findings.  And no dysrhythmia.  CBC within normal limits, BMP with mild hyponatremia of 130 today and a creatinine of 1.25 without old to compare.  However do not feel that this caused patient's seizure like activity today.  I have independently visualized  and interpreted pt's images today.  Head CT without evidence of mass or intracranial bleeding today.  Radiology reports negative.  Findings discussed with the patient.  At this time he reports just feeling generally tired.  Will ensure patient is able to ambulate but will need follow-up with neurology.  Did give precautions of no driving and following up.  Pt able to ambulate here and does report muscle pain most likely related to seizure.      Final diagnoses:  Seizure-like activity [facility])    ED Discharge Orders          Ordered    Ambulatory referral to Neurology       Comments: An appointment is requested in approximately: 2 weeks   02/25/24 1543               Doretha Folks, MD 02/25/24 1547    Doretha Folks, MD 02/25/24 1610

## 2024-02-25 NOTE — ED Triage Notes (Signed)
 Pt bib ems from work where pt had 2 episodes of seizure like activity, each lasting approx 2 minutes. No seizure hx. Postictal on ems arrival on scene. Denies recent trauma, infection. Slight abrasion to L head. Started to feel near syncopal, placed on oxygen.  HR 130 intially 140/100 CBG 95 12 lead unremarkable

## 2024-02-25 NOTE — ED Notes (Signed)
 [phone]   Cy - Mom  Trusted contact

## 2024-02-25 NOTE — ED Notes (Signed)
 DC instructions reviewed with patient. Pt safely exited ED with mother as transport. VSS and NAD.

## 2024-02-27 ENCOUNTER — Ambulatory Visit (INDEPENDENT_AMBULATORY_CARE_PROVIDER_SITE_OTHER): Payer: Self-pay | Admitting: Neurology

## 2024-02-27 ENCOUNTER — Encounter: Payer: Self-pay | Admitting: Neurology

## 2024-02-27 VITALS — BP 128/82 | HR 91 | Ht 71.0 in | Wt 185.0 lb

## 2024-02-27 DIAGNOSIS — R569 Unspecified convulsions: Secondary | ICD-10-CM

## 2024-02-27 MED ORDER — GABAPENTIN 300 MG PO CAPS: 300.0000 mg | ORAL_CAPSULE | Freq: Three times a day (TID) | ORAL | 0 refills | Status: AC

## 2024-02-27 NOTE — Progress Notes (Signed)
 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  Patient reports on August 5, while he was on orientation, first day of work, he did have a seizure.  He remembered walking to the trash can and the next thing that he remembers is people all around him.  He was told that he did have 2 seizures, described as generalized convulsion.  He was taken to the hospital, head CT negative for any acute abnormality, patient was back to his baseline and discharged home.  Since discharge from the hospital he complains of entire body soreness, pain all over including the left shoulder.  He denies any previous history of seizures, denies any seizure risk factors.  He tells me that he does drink alcohol 3-4 nights a week.  He does struggle with insomnia, usually gets about 4 to 5 hours of sleep at night.  The night prior to the seizure, he told me that he was anxious about his new job and possibly did not have a good night sleep.  Handedness: Right hand   Onset:02/25/2024  Seizure Type: Generalized convulsion (2 in one day)  Current frequency: Only once   Any injuries from seizures: Muscle soreness   Seizure risk factors: Denies   Previous ASMs: None   Currenty ASMs: None   ASMs side effects: N/A  Brain Images: Normal head CT   Previous EEGs: Not previously done    OTHER MEDICAL CONDITIONS: Alcohol use   REVIEW OF SYSTEMS: Full 14 system review of systems performed and negative with exception of: As noted in the HPI    ALLERGIES: No Known Allergies  HOME MEDICATIONS: Outpatient Medications Prior to Visit  Medication Sig Dispense Refill   HYDROcodone -acetaminophen  (NORCO) 5-325 MG per tablet Take 1 tablet by mouth every 6 (six) hours as needed for moderate pain. 30 tablet 0   omeprazole (PRILOSEC) 20 MG capsule Take 20 mg by mouth daily.     predniSONE (DELTASONE) 10 MG tablet Take 10 mg by mouth daily with breakfast. (Patient not taking: Reported on 02/27/2024)     No facility-administered medications prior to visit.    PAST MEDICAL HISTORY: Past Medical History:  Diagnosis Date   Carpal tunnel syndrome on both sides 06/2013   GERD (gastroesophageal reflux disease)     PAST SURGICAL HISTORY: Past Surgical History:  Procedure Laterality Date   CARPAL TUNNEL RELEASE Bilateral 06/29/2013   Procedure: BILATERAL CARPAL TUNNEL RELEASE;  Surgeon: Arley JONELLE Curia, MD;  Location: [facility];  Service: Orthopedics;  Laterality: Bilateral;   WISDOM TOOTH EXTRACTION      FAMILY HISTORY: History reviewed. No pertinent family history.  SOCIAL HISTORY: Social History   Socioeconomic History   Marital status: Single    Spouse name: Not on file   Number of children: Not on file   Years of education: Not on file   Highest education level: Not on file  Occupational History   Not on file  Tobacco Use   Smoking status: Never  Smokeless tobacco: Current    Types: Snuff  Substance and Sexual Activity   Alcohol use: Yes    Alcohol/week: 14.0 standard drinks of alcohol    Types: 14 Cans of beer per week   Drug use: No   Sexual activity: Not on file  Other Topics Concern   Not on file  Social History Narrative   Right handed   Caffeine- rarely   Non cdl driver for a company    Social Drivers of Health   Financial Resource Strain: Not on file  Food Insecurity: Not on file  Transportation Needs: Not on file  Physical Activity: Not on file  Stress: Not on file  Social Connections:  Not on file  Intimate Partner Violence: Not on file    PHYSICAL EXAM   GENERAL EXAM/CONSTITUTIONAL: Vitals:  Vitals:   02/27/24 1446  BP: 128/82  Pulse: 91  SpO2: 99%  Weight: 185 lb (83.9 kg)  Height: 5' 11 (1.803 m)   Body mass index is 25.8 kg/m. Wt Readings from Last 3 Encounters:  02/27/24 185 lb (83.9 kg)  02/25/24 185 lb (83.9 kg)  06/29/13 176 lb (79.8 kg)   Patient is in no distress; well developed, nourished and groomed; neck is supple, appears tremulous on exam   MUSCULOSKELETAL: Gait, strength, tone, movements noted in Neurologic exam below  NEUROLOGIC: MENTAL STATUS:      No data to display         awake, alert, oriented to person, place and time recent and remote memory intact normal attention and concentration language fluent, comprehension intact, naming intact fund of knowledge appropriate  CRANIAL NERVE:  2nd, 3rd, 4th, 6th - Visual fields full to confrontation, extraocular muscles intact, no nystagmus 5th - facial sensation symmetric 7th - facial strength symmetric 8th - hearing intact 9th - palate elevates symmetrically, uvula midline 11th - shoulder shrug symmetric 12th - tongue protrusion midline  MOTOR:  normal bulk and tone, full strength in the BUE, BLE  SENSORY:  normal and symmetric to light touch  COORDINATION:  finger-nose-finger, fine finger movements normal  GAIT/STATION:  normal   DIAGNOSTIC DATA (LABS, IMAGING, TESTING) - I reviewed patient records, labs, notes, testing and imaging myself where available.  Lab Results  Component Value Date   WBC 5.9 02/25/2024   HGB 13.9 02/25/2024   HCT 40.2 02/25/2024   MCV 97.8 02/25/2024   PLT 129 (L) 02/25/2024      Component Value Date/Time   NA 130 (L) 02/25/2024 1407   K 3.7 02/25/2024 1407   CL 99 02/25/2024 1407   CO2 18 (L) 02/25/2024 1407   GLUCOSE 138 (H) 02/25/2024 1407   BUN 8 02/25/2024 1407   CREATININE 1.25 (H) 02/25/2024 1407   CALCIUM 8.9  02/25/2024 1407   GFRNONAA >60 02/25/2024 1407   No results found for: CHOL, HDL, LDLCALC, LDLDIRECT, TRIG No results found for: HGBA1C No results found for: VITAMINB12 No results found for: TSH  Head CT 02/25/2024 1. No acute intracranial abnormality.   I personally reviewed brain Images  ASSESSMENT AND PLAN  43 y.o. 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  Seizure described as generalized convulsion.  He did have a cluster of 2 seizures.  Denies any seizure risk factors but he does drink alcohol 3-4 nights a week.  Plan for now is to obtain a routine EEG, if abnormal will likely start patient on antiseizure medication, if normal  will continue to observe patient.  Advised him to contact me if he does have another seizure.  We also discussed driving restriction for total of 6 months.  He voiced understanding.  Continue to follow with PCP and return sooner if worse.   1. Seizures (HCC)     Patient Instructions  Continue current medications Routine EEG, I will contact you to go over the result Continue to follow-up PCP Please contact me if you do have another seizure Return if worse   Per [place]  DMV statutes, patients with seizures are not allowed to drive until they have been seizure-free for six months.  Other recommendations include using caution when using heavy equipment or power tools. Avoid working on ladders or at heights. Take showers instead of baths.  Do not swim alone.  Ensure the water temperature is not too high on the home water heater. Do not go swimming alone. Do not lock yourself in a room alone (i.e. bathroom). When caring for infants or small children, sit down when holding, feeding, or changing them to minimize risk of injury to the child in the event you have a seizure. Maintain good sleep hygiene. Avoid alcohol.  Also recommend adequate sleep, hydration, good diet and minimize  stress.   During the Seizure  - First, ensure adequate ventilation and place patients on the floor on their left side  Loosen clothing around the neck and ensure the airway is patent. If the patient is clenching the teeth, do not force the mouth open with any object as this can cause severe damage - Remove all items from the surrounding that can be hazardous. The patient may be oblivious to what's happening and may not even know what he or she is doing. If the patient is confused and wandering, either gently guide him/her away and block access to outside areas - Reassure the individual and be comforting - Call 911. In most cases, the seizure ends before EMS arrives. However, there are cases when seizures may last over 3 to 5 minutes. Or the individual may have developed breathing difficulties or severe injuries. If a pregnant patient or a person with diabetes develops a seizure, it is prudent to call an ambulance. - Finally, if the patient does not regain full consciousness, then call EMS. Most patients will remain confused for about 45 to 90 minutes after a seizure, so you must use judgment in calling for help. - Avoid restraints but make sure the patient is in a bed with padded side rails - Place the individual in a lateral position with the neck slightly flexed; this will help the saliva drain from the mouth and prevent the tongue from falling backward - Remove all nearby furniture and other hazards from the area - Provide verbal assurance as the individual is regaining consciousness - Provide the patient with privacy if possible - Call for help and start treatment as ordered by the caregiver   After the Seizure (Postictal Stage)  After a seizure, most patients experience confusion, fatigue, muscle pain and/or a headache. Thus, one should permit the individual to sleep. For the next few days, reassurance is essential. Being calm and helping reorient the person is also of importance.  Most  seizures are painless and end spontaneously. Seizures are not harmful to others but can lead to complications such as stress on the lungs, brain and the heart. Individuals with prior lung problems may develop labored breathing and respiratory distress.    Discussed Patients with epilepsy have  a small risk of sudden unexpected death, a condition referred to as sudden unexpected death in epilepsy (SUDEP). SUDEP is defined specifically as the sudden, unexpected, witnessed or unwitnessed, nontraumatic and nondrowning death in patients with epilepsy with or without evidence for a seizure, and excluding documented status epilepticus, in which post mortem examination does not reveal a structural or toxicologic cause for death     Orders Placed This Encounter  Procedures   EEG adult    Meds ordered this encounter  Medications   gabapentin  (NEURONTIN ) 300 MG capsule    Sig: Take 1 capsule (300 mg total) by mouth 3 (three) times daily.    Dispense:  90 capsule    Refill:  0    Return if symptoms worsen or fail to improve.    Pastor Falling, MD 02/27/2024, 4:05 PM  [facility] [address] [place], [place] [address] [phone]

## 2024-02-27 NOTE — Patient Instructions (Signed)
 Continue current medications Routine EEG, I will contact you to go over the result Continue to follow-up PCP Please contact me if you do have another seizure Return if worse

## 2024-03-10 ENCOUNTER — Encounter: Payer: Self-pay | Admitting: *Deleted

## 2024-03-10 ENCOUNTER — Other Ambulatory Visit: Payer: Self-pay | Admitting: *Deleted
